# Patient Record
Sex: Male | Born: 1950 | Race: White | Hispanic: No | Marital: Married | Smoking: Current every day smoker
Health system: Southern US, Community
[De-identification: ages and names within clinical notes are randomized; demographics above are authoritative.]

## PROBLEM LIST (undated history)

## (undated) DIAGNOSIS — I82409 Acute embolism and thrombosis of unspecified deep veins of unspecified lower extremity: Secondary | ICD-10-CM

## (undated) DIAGNOSIS — M1990 Unspecified osteoarthritis, unspecified site: Secondary | ICD-10-CM

## (undated) DIAGNOSIS — E119 Type 2 diabetes mellitus without complications: Secondary | ICD-10-CM

---

## 2004-12-16 ENCOUNTER — Emergency Department: Payer: Self-pay | Admitting: Emergency Medicine

## 2005-08-03 ENCOUNTER — Other Ambulatory Visit: Payer: Self-pay

## 2005-08-03 ENCOUNTER — Emergency Department: Payer: Self-pay | Admitting: Unknown Physician Specialty

## 2007-03-05 ENCOUNTER — Emergency Department: Payer: Self-pay | Admitting: Emergency Medicine

## 2007-03-20 ENCOUNTER — Emergency Department: Payer: Self-pay | Admitting: Emergency Medicine

## 2008-01-28 ENCOUNTER — Emergency Department: Payer: Self-pay | Admitting: Emergency Medicine

## 2008-02-28 ENCOUNTER — Emergency Department: Payer: Self-pay | Admitting: Emergency Medicine

## 2008-02-28 ENCOUNTER — Other Ambulatory Visit: Payer: Self-pay

## 2011-11-28 ENCOUNTER — Emergency Department: Payer: Self-pay | Admitting: Emergency Medicine

## 2011-11-28 LAB — RAPID INFLUENZA A&B ANTIGENS

## 2011-11-30 LAB — BETA STREP CULTURE(ARMC)

## 2011-12-10 ENCOUNTER — Emergency Department: Payer: Self-pay | Admitting: Emergency Medicine

## 2011-12-10 LAB — COMPREHENSIVE METABOLIC PANEL
Alkaline Phosphatase: 49 U/L — ABNORMAL LOW (ref 50–136)
BUN: 12 mg/dL (ref 7–18)
Bilirubin,Total: 0.7 mg/dL (ref 0.2–1.0)
Calcium, Total: 8.7 mg/dL (ref 8.5–10.1)
Co2: 22 mmol/L (ref 21–32)
Creatinine: 0.84 mg/dL (ref 0.60–1.30)
EGFR (Non-African Amer.): >60
Glucose: 198 mg/dL — ABNORMAL HIGH (ref 65–99)
Osmolality: 283 (ref 275–301)
SGOT(AST): 16 U/L (ref 15–37)
SGPT (ALT): 19 U/L

## 2011-12-10 LAB — CBC WITH DIFFERENTIAL/PLATELET
Basophil #: 0.1 10*3/uL (ref 0.0–0.1)
Eosinophil %: 1.3 %
Lymphocyte #: 4 10*3/uL — ABNORMAL HIGH (ref 1.0–3.6)
Lymphocyte %: 32.1 %
MCH: 30.7 pg (ref 26.0–34.0)
MCV: 92 fL (ref 80–100)
Monocyte #: 0.7 10*3/uL (ref 0.0–0.7)
Monocyte %: 5.2 %
Neutrophil %: 60.9 %
Platelet: 378 10*3/uL (ref 150–440)
RBC: 4.74 10*6/uL (ref 4.40–5.90)
RDW: 12.9 % (ref 11.5–14.5)
WBC: 12.6 10*3/uL — ABNORMAL HIGH (ref 3.8–10.6)

## 2011-12-10 LAB — TROPONIN I: Troponin-I: <0.02 ng/mL

## 2011-12-10 LAB — TSH: Thyroid Stimulating Horm: 1.5 u[IU]/mL

## 2014-08-05 ENCOUNTER — Emergency Department: Payer: Self-pay | Admitting: Emergency Medicine

## 2014-08-05 LAB — CBC
HCT: 46.9 % (ref 40.0–52.0)
HGB: 15.3 g/dL (ref 13.0–18.0)
MCH: 30.5 pg (ref 26.0–34.0)
MCHC: 32.5 g/dL (ref 32.0–36.0)
MCV: 94 fL (ref 80–100)
Platelet: 277 10*3/uL (ref 150–440)
RBC: 5 10*6/uL (ref 4.40–5.90)
RDW: 13.9 % (ref 11.5–14.5)
WBC: 12.3 10*3/uL — AB (ref 3.8–10.6)

## 2014-08-05 LAB — URINALYSIS, COMPLETE
Bacteria: NONE SEEN
RBC,UR: 90215 /HPF (ref 0–5)
Specific Gravity: 1.021 (ref 1.003–1.030)
Squamous Epithelial: NONE SEEN

## 2014-08-05 LAB — BASIC METABOLIC PANEL
ANION GAP: 9 (ref 7–16)
BUN: 11 mg/dL (ref 7–18)
CALCIUM: 8.9 mg/dL (ref 8.5–10.1)
CO2: 27 mmol/L (ref 21–32)
CREATININE: 1.03 mg/dL (ref 0.60–1.30)
Chloride: 103 mmol/L (ref 98–107)
EGFR (African American): >60
EGFR (Non-African Amer.): >60
GLUCOSE: 199 mg/dL — AB (ref 65–99)
Osmolality: 283 (ref 275–301)
POTASSIUM: 4.1 mmol/L (ref 3.5–5.1)
Sodium: 139 mmol/L (ref 136–145)

## 2014-09-05 ENCOUNTER — Emergency Department: Payer: Self-pay | Admitting: Emergency Medicine

## 2014-09-05 LAB — CBC WITH DIFFERENTIAL/PLATELET
BASOS ABS: 0.1 10*3/uL (ref 0.0–0.1)
Basophil %: 0.9 %
EOS ABS: 0.1 10*3/uL (ref 0.0–0.7)
EOS PCT: 1 %
HCT: 49 % (ref 40.0–52.0)
HGB: 15.8 g/dL (ref 13.0–18.0)
LYMPHS PCT: 21.8 %
Lymphocyte #: 2.1 10*3/uL (ref 1.0–3.6)
MCH: 30.5 pg (ref 26.0–34.0)
MCHC: 32.3 g/dL (ref 32.0–36.0)
MCV: 94 fL (ref 80–100)
MONO ABS: 0.8 x10 3/mm (ref 0.2–1.0)
Monocyte %: 8.2 %
NEUTROS ABS: 6.7 10*3/uL — AB (ref 1.4–6.5)
Neutrophil %: 68.1 %
Platelet: 277 10*3/uL (ref 150–440)
RBC: 5.19 10*6/uL (ref 4.40–5.90)
RDW: 13.5 % (ref 11.5–14.5)
WBC: 9.9 10*3/uL (ref 3.8–10.6)

## 2014-09-05 LAB — BASIC METABOLIC PANEL
ANION GAP: 5 — AB (ref 7–16)
BUN: 8 mg/dL (ref 7–18)
Calcium, Total: 9.2 mg/dL (ref 8.5–10.1)
Chloride: 105 mmol/L (ref 98–107)
Co2: 29 mmol/L (ref 21–32)
Creatinine: 0.94 mg/dL (ref 0.60–1.30)
EGFR (African American): >60
Glucose: 212 mg/dL — ABNORMAL HIGH (ref 65–99)
Osmolality: 282 (ref 275–301)
Potassium: 4.2 mmol/L (ref 3.5–5.1)
SODIUM: 139 mmol/L (ref 136–145)

## 2015-10-09 ENCOUNTER — Emergency Department: Payer: Medicare Other

## 2015-10-09 ENCOUNTER — Emergency Department
Admission: EM | Admit: 2015-10-09 | Discharge: 2015-10-09 | Disposition: A | Discharge status: Home / Self Care | Payer: Medicare Other | Attending: Emergency Medicine | Admitting: Emergency Medicine

## 2015-10-09 ENCOUNTER — Encounter: Payer: Self-pay | Admitting: *Deleted

## 2015-10-09 DIAGNOSIS — E119 Type 2 diabetes mellitus without complications: Secondary | ICD-10-CM | POA: Insufficient documentation

## 2015-10-09 DIAGNOSIS — I82411 Acute embolism and thrombosis of right femoral vein: Secondary | ICD-10-CM | POA: Diagnosis not present

## 2015-10-09 DIAGNOSIS — Z880 Allergy status to penicillin: Secondary | ICD-10-CM | POA: Insufficient documentation

## 2015-10-09 DIAGNOSIS — F17200 Nicotine dependence, unspecified, uncomplicated: Secondary | ICD-10-CM | POA: Insufficient documentation

## 2015-10-09 DIAGNOSIS — M79604 Pain in right leg: Secondary | ICD-10-CM | POA: Diagnosis present

## 2015-10-09 HISTORY — DX: Type 2 diabetes mellitus without complications: E11.9

## 2015-10-09 HISTORY — DX: Unspecified osteoarthritis, unspecified site: M19.90

## 2015-10-09 HISTORY — DX: Acute embolism and thrombosis of unspecified deep veins of unspecified lower extremity: I82.409

## 2015-10-09 LAB — COMPREHENSIVE METABOLIC PANEL
ALBUMIN: 4 g/dL (ref 3.5–5.0)
ALK PHOS: 49 U/L (ref 38–126)
ALT: 15 U/L — ABNORMAL LOW (ref 17–63)
ANION GAP: 9 (ref 5–15)
AST: 14 U/L — ABNORMAL LOW (ref 15–41)
BILIRUBIN TOTAL: 0.7 mg/dL (ref 0.3–1.2)
BUN: 12 mg/dL (ref 6–20)
CALCIUM: 9.3 mg/dL (ref 8.9–10.3)
CO2: 24 mmol/L (ref 22–32)
Chloride: 106 mmol/L (ref 101–111)
Creatinine, Ser: 0.8 mg/dL (ref 0.61–1.24)
GFR calc Af Amer: >60 mL/min (ref >60)
Glucose, Bld: 218 mg/dL — ABNORMAL HIGH (ref 65–99)
Potassium: 4.4 mmol/L (ref 3.5–5.1)
Sodium: 139 mmol/L (ref 135–145)
TOTAL PROTEIN: 7 g/dL (ref 6.5–8.1)

## 2015-10-09 LAB — CBC
HEMATOCRIT: 44.6 % (ref 40.0–52.0)
HEMOGLOBIN: 14.4 g/dL (ref 13.0–18.0)
MCH: 29.1 pg (ref 26.0–34.0)
MCHC: 32.2 g/dL (ref 32.0–36.0)
MCV: 90.3 fL (ref 80.0–100.0)
Platelets: 262 10*3/uL (ref 150–440)
RBC: 4.94 MIL/uL (ref 4.40–5.90)
RDW: 13.6 % (ref 11.5–14.5)
WBC: 11.7 10*3/uL — ABNORMAL HIGH (ref 3.8–10.6)

## 2015-10-09 MED ORDER — RIVAROXABAN (XARELTO) VTE STARTER PACK (15 & 20 MG): ORAL_TABLET | ORAL | Status: AC

## 2015-10-09 NOTE — ED Provider Notes (Signed)
[facility] Emergency Department Provider Note  ____________________________________________  Time seen: Approximately 4:07 PM  I have reviewed the triage vital signs and the nursing notes.   HISTORY  Chief Complaint Leg Pain    HPI Jimmy Valenzuela is a 64 y.o. male with a history ofright lower extremity DVT 2015 with improved symptoms and taken off several toe after 3.5 months presenting with return of right lower extremity pain.  Patient reports that over the last several days he has noticed increasing pain in his right lower extremity. There is nothing he can do to make the pain better or worse. He does not have any other associated swelling, skin changes, color changes, or warmth. No trauma. Patient denies any chest pain or shortness of breath.   Past Medical History  Diagnosis Date  . DVT (deep venous thrombosis) (HCC)   . Diabetes mellitus without complication (HCC)   . Arthritis     There are no active problems to display for this patient.   History reviewed. No pertinent past surgical history.  Current Outpatient Rx  Name  Route  Sig  Dispense  Refill  . Rivaroxaban (XARELTO STARTER PACK) 15 & 20 MG TBPK      Take as directed on package: Start with one  tablet by mouth twice a day with food. On Day 22, switch to one  tablet once a day with food.   51 each   0     Allergies Morphine and related and Penicillins  No family history on file.  Social History Social History  Substance Use Topics  . Smoking status: Current Every Day Smoker  . Smokeless tobacco: None  . Alcohol Use: No    Review of Systems Constitutional: No fever/chills. No lightheadedness or syncopal. Eyes: No visual changes. ENT: No sore throat. Cardiovascular: Denies chest pain, palpitations. Respiratory: Denies shortness of breath.  No cough. Gastrointestinal: No abdominal pain.  No nausea, no vomiting.  No diarrhea.  No constipation. Musculoskeletal:  Negative for back pain. Positive for right lower extremity pain. Skin: Negative for rash. Neurological: Negative for headaches, focal weakness or numbness.  10-point ROS otherwise negative.  ____________________________________________   PHYSICAL EXAM:  VITAL SIGNS: ED Triage Vitals  Enc Vitals Group     BP 10/09/15 1342 142/75 mmHg     Pulse Rate 10/09/15 1342 93     Resp 10/09/15 1342 20     Temp 10/09/15 1342 98.3 F (36.8 C)     Temp Source 10/09/15 1342 Oral     SpO2 10/09/15 1342 96 %     Weight 10/09/15 1342 180 lb (81.647 kg)     Height 10/09/15 1342  (1.778 m)     Head Cir --      Peak Flow --      Pain Score 10/09/15 1343 9     Pain Loc --      Pain Edu? --      Excl. in GC? --     Constitutional: Alert and oriented. Well appearing and in no acute distress. Answer question appropriately. Eyes: Conjunctivae are normal.  EOMI. Head: Atraumatic. Nose: No congestion/rhinnorhea. Mouth/Throat: Mucous membranes are moist.  Neck: No stridor.  Supple.   Cardiovascular: Normal rate,   Respiratory: Normal respiratory effort.  No respiratory distress Musculoskeletal: No LE edema. No palpable cords or Homans sign. Normal skin in the right lower extremity. Normal right DP and PT pulse. Refill is less than 2 seconds. Neurologic:  Normal speech and  language. No gross focal neurologic deficits are appreciated.  Skin:  Skin is warm, dry and intact. No rash noted. Psychiatric: Mood and affect are normal. Speech and behavior are normal.  Normal judgement.  ____________________________________________   LABS (all labs ordered are listed, but only abnormal results are displayed)  Labs Reviewed  CBC - Abnormal; Notable for the following:    WBC 11.7 (*)    All other components within normal limits  COMPREHENSIVE METABOLIC PANEL - Abnormal; Notable for the following:    Glucose, Bld 218 (*)    AST 14 (*)    ALT 15 (*)    All other components within normal limits    ____________________________________________  EKG  Not indicated ____________________________________________  RADIOLOGY  US Venous Img Lower Bilateral  10/09/2015  CLINICAL DATA:  BILATERAL leg pain for 1 month EXAM: BILATERAL LOWER EXTREMITY VENOUS DOPPLER ULTRASOUND TECHNIQUE: Gray-scale sonography with graded compression, as well as color Doppler and duplex ultrasound were performed to evaluate the lower extremity deep venous systems from the level of the common femoral vein and including the common femoral, femoral, profunda femoral, popliteal and calf veins including the posterior tibial, peroneal and gastrocnemius veins when visible. The superficial great saphenous vein was also interrogated. Spectral Doppler was utilized to evaluate flow at rest and with distal augmentation maneuvers in the common femoral, femoral and popliteal veins. COMPARISON:  09/05/2014 RIGHT lower extremity duplex venous ultrasound FINDINGS: RIGHT LOWER EXTREMITY Common Femoral Vein: No evidence of thrombus. Normal compressibility, respiratory phasicity and response to augmentation. Saphenofemoral Junction: No evidence of thrombus. Normal compressibility and flow on color Doppler imaging. Profunda Femoral Vein: No evidence of thrombus. Normal compressibility and flow on color Doppler imaging. Femoral Vein: Segment of mid femoral vein demonstrates incomplete compressibility with intraluminal echogenic material which is mildly hyperechoic. This was seen on the previous exam as well as consistent with chronic DVT. No new hypoechoic intraluminal thrombus identified. Popliteal Vein: No evidence of thrombus. Normal compressibility, respiratory phasicity and response to augmentation. Calf Veins: No evidence of thrombus. Normal compressibility and flow on color Doppler imaging. Superficial Great Saphenous Vein: No evidence of thrombus. Normal compressibility and flow on color Doppler imaging. Venous Reflux:  None. Other Findings:   None. LEFT LOWER EXTREMITY Common Femoral Vein: No evidence of thrombus. Normal compressibility, respiratory phasicity and response to augmentation. Saphenofemoral Junction: No evidence of thrombus. Normal compressibility and flow on color Doppler imaging. Profunda Femoral Vein: No evidence of thrombus. Normal compressibility and flow on color Doppler imaging. Femoral Vein: No evidence of thrombus. Normal compressibility, respiratory phasicity and response to augmentation. Popliteal Vein: No evidence of thrombus. Normal compressibility, respiratory phasicity and response to augmentation. Calf Veins: No evidence of thrombus. Normal compressibility and flow on color Doppler imaging. Superficial Great Saphenous Vein: No evidence of thrombus. Normal compressibility and flow on color Doppler imaging. Venous Reflux:  None. Other Findings:  None. IMPRESSION: Segment of chronic thrombus again identified within the RIGHT femoral vein unchanged from previous exam. Otherwise negative study without evidence of acute DVT in either lower extremity. Electronically Signed   By: Ulyses Southward M.D.   On: 10/09/2015 14:53    ____________________________________________   PROCEDURES  Procedure(s) performed: None  Critical Care performed: No ____________________________________________   INITIAL IMPRESSION / ASSESSMENT AND PLAN / ED COURSE  Pertinent labs & imaging results that were available during my care of the patient were reviewed by me and considered in my medical decision making (see chart for details).  64 y.o. male  with a history of DVT presenting with recurrent right lower extremity DVT. The patient will need to be restarted on Xarelto. I will check basic labs before discharging him to rule out anemia or renal or hepatic insufficiency. He will follow-up with his primary care physician. At this time he has no signs or symptoms that would be concerning for  PE.  ____________________________________________  FINAL CLINICAL IMPRESSION(S) / ED DIAGNOSES  Final diagnoses:  Acute deep vein thrombosis (DVT) of femoral vein of right lower extremity (HCC)      NEW MEDICATIONS STARTED DURING THIS VISIT:  New Prescriptions   RIVAROXABAN (XARELTO STARTER PACK) 15 & 20 MG TBPK    Take as directed on package: Start with one 15mg  tablet by mouth twice a day with food. On Day 22, switch to one 20mg  tablet once a day with food.     Rockne MenghiniAnne-Caroline Lauriel Helin, MD 10/09/15 1655

## 2015-10-09 NOTE — Discharge Instructions (Signed)
Please make a follow-up appointment with your primary care physician. He will need to be reevaluated for the length of time that you need to be on a blood thinner medicine. Please do not take any NSAID medications such as Motrin, Advil, ibuprofen or Aleve when you are taking Xarelto. Please return to the emergency department if he developed chest pain, shortness of breath, palpitations, fainting, bleeding, or any other symptoms concerning to you.  Deep Vein Thrombosis A deep vein thrombosis (DVT) is a blood clot (thrombus) that usually occurs in a deep, larger vein of the lower leg or the pelvis, or in an upper extremity such as the arm. These are dangerous and can lead to serious and even life-threatening complications if the clot travels to the lungs. A DVT can damage the valves in your leg veins so that instead of flowing upward, the blood pools in the lower leg. This is called post-thrombotic syndrome, and it can result in pain, swelling, discoloration, and sores on the leg. CAUSES A DVT is caused by the formation of a blood clot in your leg, pelvis, or arm. Usually, several things contribute to the formation of blood clots. A clot may develop when:  Your blood flow slows down.  Your vein becomes damaged in some way.  You have a condition that makes your blood clot more easily. RISK FACTORS A DVT is more likely to develop in:  People who are older, especially over 32 years of age.  People who are overweight (obese).  People who sit or lie still for a long time, such as during long-distance travel (over 4 hours), bed rest, hospitalization, or during recovery from certain medical conditions like a stroke.  People who do not engage in much physical activity (sedentary lifestyle).  People who have chronic breathing disorders.  People who have a personal or family history of blood clots or blood clotting disease.  People who have peripheral vascular disease (PVD), diabetes, or some types  of cancer.  People who have heart disease, especially if the person had a recent heart attack or has congestive heart failure.  People who have neurological diseases that affect the legs (leg paresis).  People who have had a traumatic injury, such as breaking a hip or leg.  People who have recently had major or lengthy surgery, especially on the hip, knee, or abdomen.  People who have had a central line placed inside a large vein.  People who take medicines that contain the hormone estrogen. These include birth control pills and hormone replacement therapy.  Pregnancy or during childbirth or the postpartum period.  Long plane flights (over 8 hours). SIGNS AND SYMPTOMS Symptoms of a DVT can include:   Swelling of your leg or arm, especially if one side is much worse.  Warmth and redness of your leg or arm, especially if one side is much worse.  Pain in your arm or leg. If the clot is in your leg, symptoms may be more noticeable or worse when you stand or walk.  A feeling of pins and needles, if the clot is in the arm. The symptoms of a DVT that has traveled to the lungs (pulmonary embolism, PE) usually start suddenly and include:  Shortness of breath while active or at rest.  Coughing or coughing up blood or blood-tinged mucus.  Chest pain that is often worse with deep breaths.  Rapid or irregular heartbeat.  Feeling light-headed or dizzy.  Fainting.  Feeling anxious.  Sweating. There may also be  pain and swelling in a leg if that is where the blood clot started. These symptoms may represent a serious problem that is an emergency. Do not wait to see if the symptoms will go away. Get medical help right away. Call your local emergency services (911 in the [place] Do not drive yourself to the hospital. DIAGNOSIS Your health care provider will take a medical history and perform a physical exam. You may also have other tests, including:  Blood tests to assess the clotting  properties of your blood.  Imaging tests, such as CT, ultrasound, MRI, X-ray, and other tests to see if you have clots anywhere in your body. TREATMENT After a DVT is identified, it can be treated. The type of treatment that you receive depends on many factors, such as the cause of your DVT, your risk for bleeding or developing more clots, and other medical conditions that you have. Sometimes, a combination of treatments is necessary. Treatment options may be combined and include:  Monitoring the blood clot with ultrasound.  Taking medicines by mouth, such as newer blood thinners (anticoagulants), thrombolytics, or warfarin.  Taking anticoagulant medicine by injection or through an IV tube.  Wearing compression stockings or using different types ofdevices.  Surgery (rare) to remove the blood clot or to place a filter in your abdomen to stop the blood clot from traveling to your lungs. Treatments for a DVT are often divided into immediate treatment and long-term treatment (up to 3 months after DVT). You can work with your health care provider to choose the treatment program that is best for you. HOME CARE INSTRUCTIONS If you are taking a newer oral anticoagulant:  Take the medicine every single day at the same time each day.  Understand what foods and drugs interact with this medicine.  Understand that there are no regular blood tests required when using this medicine.  Understand the side effects of this medicine, including excessive bruising or bleeding. Ask your health care provider or pharmacist about other possible side effects. If you are taking warfarin:  Understand how to take warfarin and know which foods can affect how warfarin works in Public relations account executiveyour body.  Understand that it is dangerous to take too much or too little warfarin. Too much warfarin increases the risk of bleeding. Too little warfarin continues to allow the risk for blood clots.  Follow your PT and INR blood testing  schedule. The PT and INR results allow your health care provider to adjust your dose of warfarin. It is very important that you have your PT and INR tested as often as told by your health care provider.  Avoid major changes in your diet, or tell your health care provider before you change your diet. Arrange a visit with a registered dietitian to answer your questions. Many foods, especially foods that are high in vitamin K, can interfere with warfarin and affect the PT and INR results. Eat a consistent amount of foods that are high in vitamin K, such as:  Spinach, kale, broccoli, cabbage, collard greens, turnip greens, Brussels sprouts, peas, cauliflower, seaweed, and parsley.  Beef liver and pork liver.  Green tea.  Soybean oil.  Tell your health care provider about any and all medicines, vitamins, and supplements that you take, including aspirin and other over-the-counter anti-inflammatory medicines. Be especially cautious with aspirin and anti-inflammatory medicines. Do not take those before you ask your health care provider if it is safe to do so. This is important because many medicines can interfere  with warfarin and affect the PT and INR results.  Do not start or stop taking any over-the-counter or prescription medicine unless your health care provider or pharmacist tells you to do so. If you take warfarin, you will also need to do these things:  Hold pressure over cuts for longer than usual.  Tell your dentist and other health care providers that you are taking warfarin before you have any procedures in which bleeding may occur.  Avoid alcohol or drink very small amounts. Tell your health care provider if you change your alcohol intake.  Do not use tobacco products, including cigarettes, chewing tobacco, and e-cigarettes. If you need help quitting, ask your health care provider.  Avoid contact sports. General Instructions  Take over-the-counter and prescription medicines only as  told by your health care provider. Anticoagulant medicines can have side effects, including easy bruising and difficulty stopping bleeding. If you are prescribed an anticoagulant, you will also need to do these things:  Hold pressure over cuts for longer than usual.  Tell your dentist and other health care providers that you are taking anticoagulants before you have any procedures in which bleeding may occur.  Avoid contact sports.  Wear a medical alert bracelet or carry a medical alert card that says you have had a PE.  Ask your health care provider how soon you can go back to your normal activities. Stay active to prevent new blood clots from forming.  Make sure to exercise while traveling or when you have been sitting or standing for a long period of time. It is very important to exercise. Exercise your legs by walking or by tightening and relaxing your leg muscles often. Take frequent walks.  Wear compression stockings as told by your health care provider to help prevent more blood clots from forming.  Do not use tobacco products, including cigarettes, chewing tobacco, and e-cigarettes. If you need help quitting, ask your health care provider.  Keep all follow-up appointments with your health care provider. This is important. PREVENTION Take these actions to decrease your risk of developing another DVT:  Exercise regularly. For at least 30 minutes every day, engage in:  Activity that involves moving your arms and legs.  Activity that encourages good blood flow through your body by increasing your heart rate.  Exercise your arms and legs every hour during long-distance travel (over 4 hours). Drink plenty of water and avoid drinking alcohol while traveling.  Avoid sitting or lying in bed for long periods of time without moving your legs.  Maintain a weight that is appropriate for your height. Ask your health care provider what weight is healthy for you.  If you are a woman who is  over 31 years of age, avoid unnecessary use of medicines that contain estrogen. These include birth control pills.  Do not smoke, especially if you take estrogen medicines. If you need help quitting, ask your health care provider. If you are hospitalized, prevention measures may include:  Early walking after surgery, as soon as your health care provider says that it is safe.  Receiving anticoagulants to prevent blood clots.If you cannot take anticoagulants, other options may be available, such as wearing compression stockings or using different types of devices. SEEK IMMEDIATE MEDICAL CARE IF:  You have new or increased pain, swelling, or redness in an arm or leg.  You have numbness or tingling in an arm or leg.  You have shortness of breath while active or at rest.  You have chest  pain.  You have a rapid or irregular heartbeat.  You feel light-headed or dizzy.  You cough up blood.  You notice blood in your vomit, bowel movement, or urine. These symptoms may represent a serious problem that is an emergency. Do not wait to see if the symptoms will go away. Get medical help right away. Call your local emergency services (911 in the [place] Do not drive yourself to the hospital.   This information is not intended to replace advice given to you by your health care provider. Make sure you discuss any questions you have with your health care provider.   Document Released: 10/24/2005 Document Revised: 07/15/2015 Document Reviewed: 02/18/2015 Elsevier Interactive Patient Education [employer]

## 2015-10-09 NOTE — ED Notes (Signed)
Pt resting in bed in no acute distress 

## 2015-10-09 NOTE — ED Notes (Signed)
Has pain in both calf, his hips,

## 2016-07-01 ENCOUNTER — Encounter: Payer: Self-pay | Admitting: Emergency Medicine

## 2016-07-01 ENCOUNTER — Emergency Department
Admission: EM | Admit: 2016-07-01 | Discharge: 2016-07-01 | Disposition: A | Discharge status: Home / Self Care | Payer: Medicare Other | Attending: Emergency Medicine | Admitting: Emergency Medicine

## 2016-07-01 DIAGNOSIS — K649 Unspecified hemorrhoids: Secondary | ICD-10-CM | POA: Diagnosis not present

## 2016-07-01 DIAGNOSIS — F17200 Nicotine dependence, unspecified, uncomplicated: Secondary | ICD-10-CM | POA: Insufficient documentation

## 2016-07-01 DIAGNOSIS — I87009 Postthrombotic syndrome without complications of unspecified extremity: Secondary | ICD-10-CM | POA: Insufficient documentation

## 2016-07-01 DIAGNOSIS — E119 Type 2 diabetes mellitus without complications: Secondary | ICD-10-CM | POA: Diagnosis not present

## 2016-07-01 NOTE — ED Notes (Signed)
Patient called the second time for triage. No response. RN will reattempt to call patient one final time prior to eloping from the status board.   

## 2016-07-01 NOTE — ED Triage Notes (Signed)
hemorrhoid pain x 6 days.  Has been using preparation H, no relief.

## 2016-07-01 NOTE — ED Notes (Signed)
Patient called for triage by this RN. No answer. Will reattempt. 

## 2016-07-01 NOTE — ED Notes (Signed)
Patient called for the third time. No response. Patient eloped from the status board at this time. ED registration and charge nurse made aware so that in the event that patient presents back to the desk requesting to be seen. 

## 2017-09-22 ENCOUNTER — Other Ambulatory Visit: Payer: Self-pay

## 2017-09-22 ENCOUNTER — Emergency Department: Payer: Medicare Other

## 2017-09-22 ENCOUNTER — Encounter: Payer: Self-pay | Admitting: Emergency Medicine

## 2017-09-22 ENCOUNTER — Emergency Department
Admission: EM | Admit: 2017-09-22 | Discharge: 2017-09-22 | Disposition: A | Discharge status: Home / Self Care | Payer: Medicare Other | Attending: Emergency Medicine | Admitting: Emergency Medicine

## 2017-09-22 DIAGNOSIS — Z7901 Long term (current) use of anticoagulants: Secondary | ICD-10-CM | POA: Diagnosis not present

## 2017-09-22 DIAGNOSIS — W109XXA Fall (on) (from) unspecified stairs and steps, initial encounter: Secondary | ICD-10-CM | POA: Insufficient documentation

## 2017-09-22 DIAGNOSIS — S3992XA Unspecified injury of lower back, initial encounter: Secondary | ICD-10-CM | POA: Diagnosis present

## 2017-09-22 DIAGNOSIS — W19XXXA Unspecified fall, initial encounter: Secondary | ICD-10-CM

## 2017-09-22 DIAGNOSIS — S39012A Strain of muscle, fascia and tendon of lower back, initial encounter: Secondary | ICD-10-CM | POA: Insufficient documentation

## 2017-09-22 DIAGNOSIS — Y92008 Other place in unspecified non-institutional (private) residence as the place of occurrence of the external cause: Secondary | ICD-10-CM | POA: Insufficient documentation

## 2017-09-22 DIAGNOSIS — Y999 Unspecified external cause status: Secondary | ICD-10-CM | POA: Diagnosis not present

## 2017-09-22 DIAGNOSIS — S29019A Strain of muscle and tendon of unspecified wall of thorax, initial encounter: Secondary | ICD-10-CM | POA: Diagnosis not present

## 2017-09-22 DIAGNOSIS — Y9301 Activity, walking, marching and hiking: Secondary | ICD-10-CM | POA: Insufficient documentation

## 2017-09-22 DIAGNOSIS — F17200 Nicotine dependence, unspecified, uncomplicated: Secondary | ICD-10-CM | POA: Diagnosis not present

## 2017-09-22 DIAGNOSIS — E119 Type 2 diabetes mellitus without complications: Secondary | ICD-10-CM | POA: Diagnosis not present

## 2017-09-22 NOTE — Discharge Instructions (Signed)
Modify activities until symptoms improve.  You may use cold/cold pack or heat therapy for symptom management as needed.  If symptoms not fully resolve you may follow-up with orthopedics.  Contact information is included in your discharge instructions.  If symptoms significantly worsen do not hesitate to return the emergency department.

## 2017-09-22 NOTE — ED Notes (Signed)
Patient presents to the ED with mid back pain post fall this morning at 8am.  Patient states he slipped on some ice on his deck while going down steps and fell on the steps.  Patient reports some shortness of breath with the back pain.  Patient is speaking in full sentences at this time.  Patient denies hitting his head or losing consciousness.  Patient denies any neck pain.  Patient is complaining of pain to his left buttocks.

## 2017-09-22 NOTE — ED Provider Notes (Signed)
[facility] Emergency Department Provider Note   ____________________________________________   I have reviewed the triage vital signs and the nursing notes.   HISTORY  Chief Complaint Fall    HPI Jimmy Valenzuela is a 66 y.o. male to the emergency department with thoracic and lumbar back pain after falling on his deck steps earlier this morning.  Patient describes striking his mid back during the fall.  Patient denies loss of consciousness, hitting his head or neck.  Patient reports immediate pain in his back and denies any other injury.  Patient reports past muscular skeletal back injuries requiring no surgical intervention.  Patient denies any lower extremity radicular symptoms, bowel or bladder dysfunction or saddle anesthesia. Patient denies fever, chills, headache, vision changes, chest pain, chest tightness, shortness of breath, abdominal pain, nausea and vomiting.  Past Medical History:  Diagnosis Date  . Arthritis   . Diabetes mellitus without complication (HCC)   . DVT (deep venous thrombosis) (HCC)     There are no active problems to display for this patient.   History reviewed. No pertinent surgical history.  Prior to Admission medications   Medication Sig Start Date End Date Taking? Authorizing Provider  Rivaroxaban (XARELTO STARTER PACK) 15 & 20 MG TBPK Take as directed on package: Start with one 15mg  tablet by mouth twice a day with food. On Day 22, switch to one 20mg  tablet once a day with food. 10/09/15   Rockne MenghiniNorman, Anne-Caroline, MD    Allergies Morphine and related and Penicillins  No family history on file.  Social History Social History   Tobacco Use  . Smoking status: Current Every Day Smoker  . Smokeless tobacco: Never Used  Substance Use Topics  . Alcohol use: No  . Drug use: Not on file    Review of Systems Constitutional: Negative for fever/chills Eyes: No visual changes. ENT:  Negative for sore throat and for  difficulty swallowing Cardiovascular: Denies chest pain. Respiratory: Denies shortness of breath. Musculoskeletal: Positive for thoracic and lumbar back pain. Skin: Negative for rash. Neurological: Negative for headaches.  Negative focal weakness or numbness. Negative for loss of consciousness. Able to ambulate. ____________________________________________   PHYSICAL EXAM:  VITAL SIGNS: Patient Vitals for the past 24 hrs:  BP Temp Temp src Pulse Resp SpO2 Height Weight  09/22/17 1314 125/68 98 F (36.7 C) Oral 78 16 100 % - -  09/22/17 1008 - - - - - - 5\' 10"  (1.778 m) 67.1 kg (148 lb)  09/22/17 1007 (!) 163/105 98.3 F (36.8 C) Oral 87 18 100 % - -    Constitutional: Alert and oriented. Well appearing and in no acute distress.  Eyes: Conjunctivae are normal. PERRL. EOMI  Head: Normocephalic and atraumatic. Neck:Supple. No thyromegaly. No stridor.  Cardiovascular: Normal rate, regular rhythm.  Good peripheral circulation. Respiratory: Normal respiratory effort without tachypnea or retractions. Lungs CTAB.  Hematological/Lymphatic/Immunological: No cervical lymphadenopathy. Musculoskeletal:Thoracic and lumbar range of motion all planes intact. Tenderness to palpation along the lower thoracic and lumbar spinous processes,paraspinal musculature. No deformities noted along the thoracic or lumbar spine with palpation.   Negative lower extremity radiculopathy, bowel or bladder dysfunction or saddle anesthesia. No deformity or paradoxical movement of rib cage noted.  Neurologic: Normal speech and language.  Skin:  Skin is warm, dry and intact. No rash noted. Psychiatric: Mood and affect are normal. Speech and behavior are normal. Patient exhibits appropriate insight and judgement.  ____________________________________________   LABS (all labs ordered are listed, but only  abnormal results are displayed)  Labs Reviewed - No data to  display ____________________________________________  EKG none ____________________________________________  RADIOLOGY DG Chest 2 view / DG Thoracic spine 2 view / DG Lumbar spine complete  FINDINGS: The cardiomediastinal silhouette is normal in size. Normal pulmonary vascularity. Slightly coarsened interstitial markings, likely smoking-related. No focal consolidation, pleural effusion, or pneumothorax. Skin fold over the peripheral right lung. No acute osseous abnormality. No definite rib fracture.  IMPRESSION: No active cardiopulmonary disease.  FINDINGS: 12 rib-bearing thoracic vertebrae with anatomic posterior alignment. No fractures. DISH throughout the thoracic spine. Pedicles intact. No paraspinous hematoma.  IMPRESSION: 1. No acute osseous abnormality. 2. DISH.   FINDINGS: Five non-rib-bearing lumbar vertebrae. Degenerative slight grade 1 spondylolisthesis of L4 on L5 measuring approximately 7 mm. Well-preserved disc spaces throughout the lumbar spine. Degenerative disc disease at T11-12 and T12-L1. Severe facet degenerative changes at L4-5 and L5-S1.  Calcified gallstones in the right upper quadrant the abdomen. Aortoiliac atherosclerosis without evidence of aneurysm. Calcification involving the vasa deferens bilaterally.  IMPRESSION: 1. No acute osseous abnormality. 2. Severe facet degenerative changes at L4-5 and L5-S1 resulting in degenerative grade 1 spondylolisthesis of L4 on L5 measuring approximately 7 mm. 3. Cholelithiasis.  ____________________________________________   PROCEDURES  Procedure(s) performed: no    Critical Care performed: no ____________________________________________   INITIAL IMPRESSION / ASSESSMENT AND PLAN / ED COURSE  Pertinent labs & imaging results that were available during my care of the patient were reviewed by me and considered in my medical decision making (see chart for details).   Patient presents to  emergency department thoracic and lumbar back pain after falling on his deck steps earlier this morning. History, physical exam findings and imaging are reassuring symptoms are consistent with thoracic and lumbar sprain/strain. Imaging was unremarkable for acute fracture, dislocation, subluxation, pneumothorax or pleural effusion. Patient advised to modify activity until symptoms improve. Patient refused pain management during course of care today. He stated he would take his pain medication once he arrived home. Advised him to follow up with PCP as needed or return to the emergency department if symptoms return or worsen. Patient informed of clinical course, understand medical decision-making process, and agree with plan.   ____________________________________________   FINAL CLINICAL IMPRESSION(S) / ED DIAGNOSES  Final diagnoses:  Fall, initial encounter  Strain of lumbar region, initial encounter  Thoracic myofascial strain, initial encounter       NEW MEDICATIONS STARTED DURING THIS VISIT:  This SmartLink is deprecated. Use AVSMEDLIST instead to display the medication list for a patient.   Note:  This document was prepared using Dragon voice recognition software and may include unintentional dictation errors.    Clois ComberLittle, Traci M, PA-C 09/22/17 1647    Jeanmarie PlantMcShane, James A, MD 09/23/17 [phone]

## 2017-09-22 NOTE — ED Triage Notes (Signed)
Fell this morning, slid on ice and hit mid back on steps.  C/O pain to back with movement and deep breathing.

## 2017-12-27 ENCOUNTER — Emergency Department
Admission: EM | Admit: 2017-12-27 | Discharge: 2017-12-27 | Disposition: A | Discharge status: Home / Self Care | Payer: Medicare Other | Attending: Emergency Medicine | Admitting: Emergency Medicine

## 2017-12-27 ENCOUNTER — Other Ambulatory Visit: Payer: Self-pay

## 2017-12-27 DIAGNOSIS — M79672 Pain in left foot: Secondary | ICD-10-CM

## 2017-12-27 DIAGNOSIS — Z794 Long term (current) use of insulin: Secondary | ICD-10-CM | POA: Diagnosis not present

## 2017-12-27 DIAGNOSIS — Z79899 Other long term (current) drug therapy: Secondary | ICD-10-CM | POA: Diagnosis not present

## 2017-12-27 DIAGNOSIS — Z86718 Personal history of other venous thrombosis and embolism: Secondary | ICD-10-CM | POA: Insufficient documentation

## 2017-12-27 DIAGNOSIS — B9689 Other specified bacterial agents as the cause of diseases classified elsewhere: Secondary | ICD-10-CM | POA: Insufficient documentation

## 2017-12-27 DIAGNOSIS — F17210 Nicotine dependence, cigarettes, uncomplicated: Secondary | ICD-10-CM | POA: Insufficient documentation

## 2017-12-27 DIAGNOSIS — E109 Type 1 diabetes mellitus without complications: Secondary | ICD-10-CM | POA: Insufficient documentation

## 2017-12-27 DIAGNOSIS — L089 Local infection of the skin and subcutaneous tissue, unspecified: Secondary | ICD-10-CM | POA: Insufficient documentation

## 2017-12-27 DIAGNOSIS — L84 Corns and callosities: Secondary | ICD-10-CM | POA: Diagnosis not present

## 2017-12-27 MED ORDER — TRAMADOL HCL 50 MG PO TABS
50.0000 mg | ORAL_TABLET | Freq: Once | ORAL | Status: DC
  Filled 2017-12-27: qty 1

## 2017-12-27 MED ORDER — SULFAMETHOXAZOLE-TRIMETHOPRIM 800-160 MG PO TABS: 1.0000 | ORAL_TABLET | Freq: Two times a day (BID) | ORAL | 0 refills | Status: AC

## 2017-12-27 MED ORDER — LIDOCAINE HCL (PF) 1 % IJ SOLN
5.0000 mL | Freq: Once | INTRAMUSCULAR | Status: AC
  Administered 2017-12-27: 5 mL

## 2017-12-27 MED ORDER — LIDOCAINE HCL (PF) 1 % IJ SOLN
INTRAMUSCULAR | Status: AC
  Administered 2017-12-27: 5 mL
  Filled 2017-12-27: qty 5

## 2017-12-27 MED ORDER — LIDOCAINE-EPINEPHRINE-TETRACAINE (LET) SOLUTION
3.0000 mL | Freq: Once | NASAL | Status: AC
Start: 2017-12-27 — End: 2017-12-27
  Administered 2017-12-27: 14:00:00 3 mL via TOPICAL
  Filled 2017-12-27: qty 3

## 2017-12-27 MED ORDER — SULFAMETHOXAZOLE-TRIMETHOPRIM 800-160 MG PO TABS
1.0000 | ORAL_TABLET | Freq: Once | ORAL | Status: AC
  Administered 2017-12-27: 1 via ORAL
  Filled 2017-12-27: qty 1

## 2017-12-27 MED ORDER — TRAMADOL HCL 50 MG PO TABS: 50.0000 mg | ORAL_TABLET | Freq: Four times a day (QID) | ORAL | 0 refills | Status: AC | PRN

## 2017-12-27 NOTE — ED Notes (Signed)
See triage note  States he noticed a callous area to left foot near great toe several days ago  But noticed some bleeding and redness to foot 2 days ago

## 2017-12-27 NOTE — ED Triage Notes (Signed)
Pt is type 2 DM. State L foot pain has callus at base of large toe (1st toe) callus has cracked and some blood noted. Bleeding controlled. States real painful. Appears to have blood collected under callus as well. Very tender to touch. Only redness is streak of redness going back toward ankle. Alert, oriented.

## 2017-12-27 NOTE — ED Provider Notes (Signed)
[facility] Emergency Department Provider Note   ____________________________________________   None    (approximate)  I have reviewed the triage vital signs and the nursing notes.   HISTORY  Chief Complaint Foot Pain    HPI Jimmy Valenzuela is a 67 y.o. male patient complain left foot pain secondary to callus on the plantar aspect of the first metatarsal head.  Patient states callus lesion has cracked and noticed blood and pus drainage.  Patient rates pain as a 10/10.  Area cannot bear weight secondary to pain.  Patient type I diabetic.  Patient states he has tried to trim the callus from the foot without success.  Past Medical History:  Diagnosis Date  . Arthritis   . Diabetes mellitus without complication (HCC)   . DVT (deep venous thrombosis) (HCC)     There are no active problems to display for this patient.   History reviewed. No pertinent surgical history.  Prior to Admission medications   Medication Sig Start Date End Date Taking? Authorizing Provider  atorvastatin (LIPITOR) 20 MG tablet Take 20 mg by mouth daily.   Yes [provider]  insulin glargine (LANTUS) 100 UNIT/ML injection Inject into the skin at bedtime.   Yes [provider]  metFORMIN (GLUCOPHAGE) 1000 MG tablet Take 1,000 mg by mouth 2 (two) times daily with a meal.   Yes [provider]  pioglitazone (ACTOS) 45 MG tablet Take 45 mg by mouth daily.   Yes [provider]  Rivaroxaban (XARELTO STARTER PACK) 15 & 20 MG TBPK Take as directed on package: Start with one 15mg  tablet by mouth twice a day with food. On Day 22, switch to one 20mg  tablet once a day with food. 10/09/15   Rockne MenghiniNorman, Anne-Caroline, MD  sulfamethoxazole-trimethoprim (BACTRIM DS,SEPTRA DS) 800-160 MG tablet Take 1 tablet by mouth 2 (two) times daily. 12/27/17   Joni ReiningSmith, Vivion Romano K, PA-C  traMADol (ULTRAM) 50 MG tablet Take 1 tablet (50 mg total) by mouth every 6 (six) hours as needed.  12/27/17 12/27/18  Joni ReiningSmith, Keevin Panebianco K, PA-C    Allergies Morphine and related and Penicillins  History reviewed. No pertinent family history.  Social History Social History   Tobacco Use  . Smoking status: Current Every Day Smoker  . Smokeless tobacco: Never Used  Substance Use Topics  . Alcohol use: No  . Drug use: Not on file    Review of Systems Constitutional: No fever/chills Eyes: No visual changes. ENT: No sore throat. Cardiovascular: Denies chest pain. Respiratory: Denies shortness of breath. Gastrointestinal: No abdominal pain.  No nausea, no vomiting.  No diarrhea.  No constipation. Genitourinary: Negative for dysuria. Musculoskeletal: Negative for back pain. Skin: Negative for rash.  Callus plantar aspect of left foot Neurological: Negative for headaches, focal weakness or numbness. Endocrine:Diabetes and hyperlipidemia  ____________________________________________   PHYSICAL EXAM:  VITAL SIGNS: ED Triage Vitals  Enc Vitals Group     BP 12/27/17 1234 (!) 152/80     Pulse Rate 12/27/17 1234 94     Resp 12/27/17 1234 20     Temp 12/27/17 1234 97.7 F (36.5 C)     Temp Source 12/27/17 1234 Oral     SpO2 12/27/17 1234 100 %     Weight 12/27/17 1234 149 lb (67.6 kg)     Height 12/27/17 1234 5\' 10"  (1.778 m)     Head Circumference --      Peak Flow --      Pain Score 12/27/17 1243  10     Pain Loc --      Pain Edu? --      Excl. in GC? --    Constitutional: Alert and oriented. Well appearing and in no acute distress. Cardiovascular: Normal rate, regular rhythm. Grossly normal heart sounds.  Good peripheral circulation. Respiratory: Normal respiratory effort.  No retractions. Lungs CTAB. Neurologic:  Normal speech and language. No gross focal neurologic deficits are appreciated. No gait instability. Skin:  Skin is warm, dry and intact. No rash noted.  Erythema arising from the plantar aspect to the dorsal aspect of the left foot.  Thick callus plantar aspect  first metatarsal head. Psychiatric: Mood and affect are normal. Speech and behavior are normal.  ____________________________________________   LABS (all labs ordered are listed, but only abnormal results are displayed)  Labs Reviewed - No data to display ____________________________________________  EKG   ____________________________________________  RADIOLOGY  ED MD interpretation:    Official radiology report(s): No results found.  ____________________________________________   PROCEDURES  Procedure(s) performed: None  Procedures  Critical Care performed: No  ____________________________________________   INITIAL IMPRESSION / ASSESSMENT AND PLAN / ED COURSE  As part of my medical decision making, I reviewed the following data within the electronic MEDICAL RECORD NUMBER    Left foot pain secondary to callus and cellulitis.  Patient given discharge care instructions and advised to contact podiatry for definitive evaluation and treatment.  Patient started on Bactrim DS and given tramadol as needed for pain.      ____________________________________________   FINAL CLINICAL IMPRESSION(S) / ED DIAGNOSES  Final diagnoses:  Localized bacterial skin infection  Foot pain, left  Plantar callus     ED Discharge Orders        Ordered    sulfamethoxazole-trimethoprim (BACTRIM DS,SEPTRA DS) 800-160 MG tablet  2 times daily     12/27/17 1456    traMADol (ULTRAM) 50 MG tablet  Every 6 hours PRN     12/27/17 1456       Note:  This document was prepared using Dragon voice recognition software and may include unintentional dictation errors.    Joni Reining, PA-C 12/27/17 1506    Governor Rooks, MD 12/27/17 [phone]

## 2017-12-27 NOTE — Discharge Instructions (Addendum)
Contact podiatry clinic to scheduled appointment.

## 2017-12-29 ENCOUNTER — Other Ambulatory Visit
Admission: RE | Admit: 2017-12-29 | Discharge: 2017-12-29 | Disposition: A | Discharge status: Home / Self Care | Payer: Medicare Other | Source: Other Acute Inpatient Hospital | Attending: Podiatry | Admitting: Podiatry

## 2017-12-29 DIAGNOSIS — L97521 Non-pressure chronic ulcer of other part of left foot limited to breakdown of skin: Secondary | ICD-10-CM | POA: Diagnosis present

## 2018-01-03 LAB — AEROBIC/ANAEROBIC CULTURE W GRAM STAIN (SURGICAL/DEEP WOUND): Gram Stain: NONE SEEN

## 2018-05-09 IMAGING — CR DG THORACIC SPINE 2V
1 series · 3 of 3 positions shown · non-contrast
Comparison: No prior thoracic spine imaging. Chest x-rays
11/28/2011, 02/28/2008.

CLINICAL DATA: 66-year-old who slipped on ice and fell down the
deck stairs at his home this morning. Mid and low back pain. Initial
encounter.

EXAM:
THORACIC SPINE 2 VIEWS

[Series 1: dg thoracic spine 2 view · 0.14mm/px · 3 of 3 slices shown]
[im 1/3]
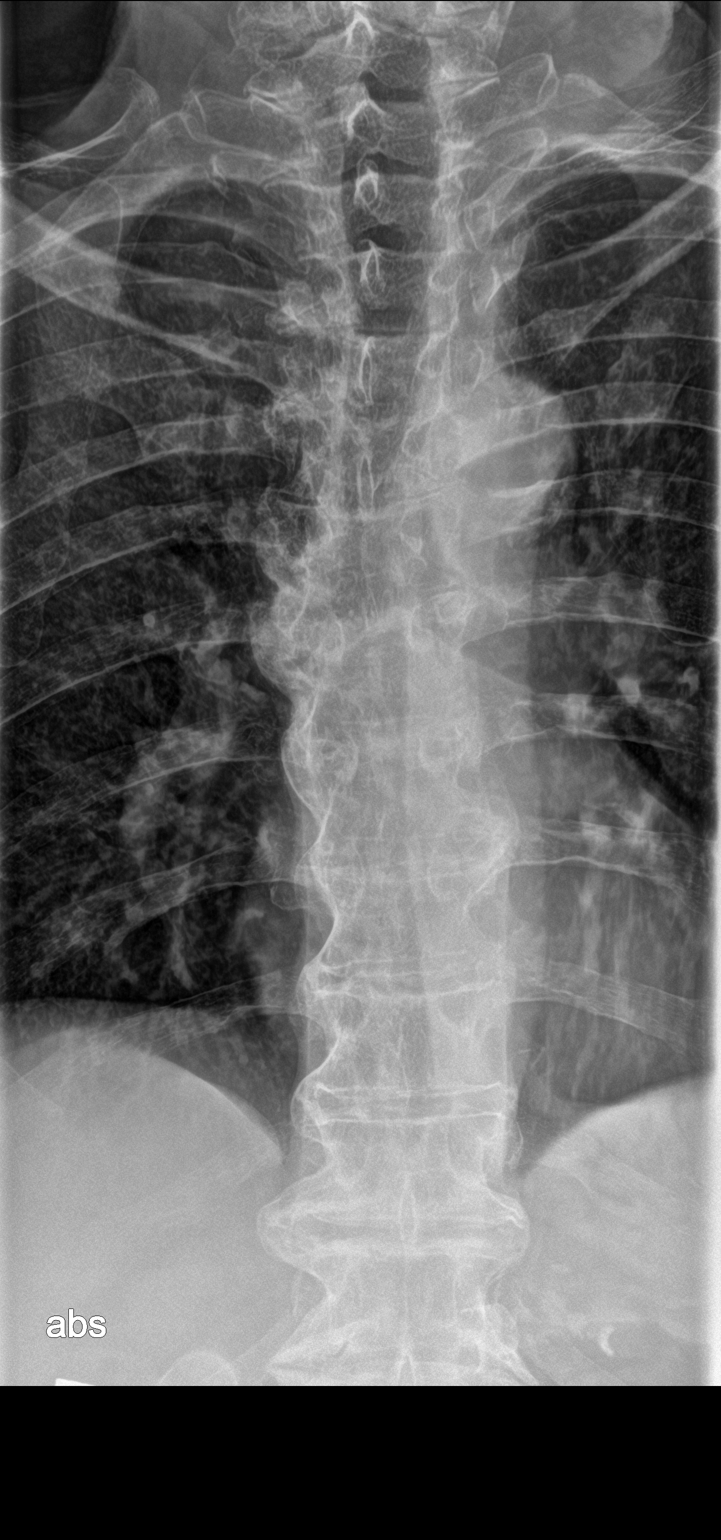
[im 2/3]
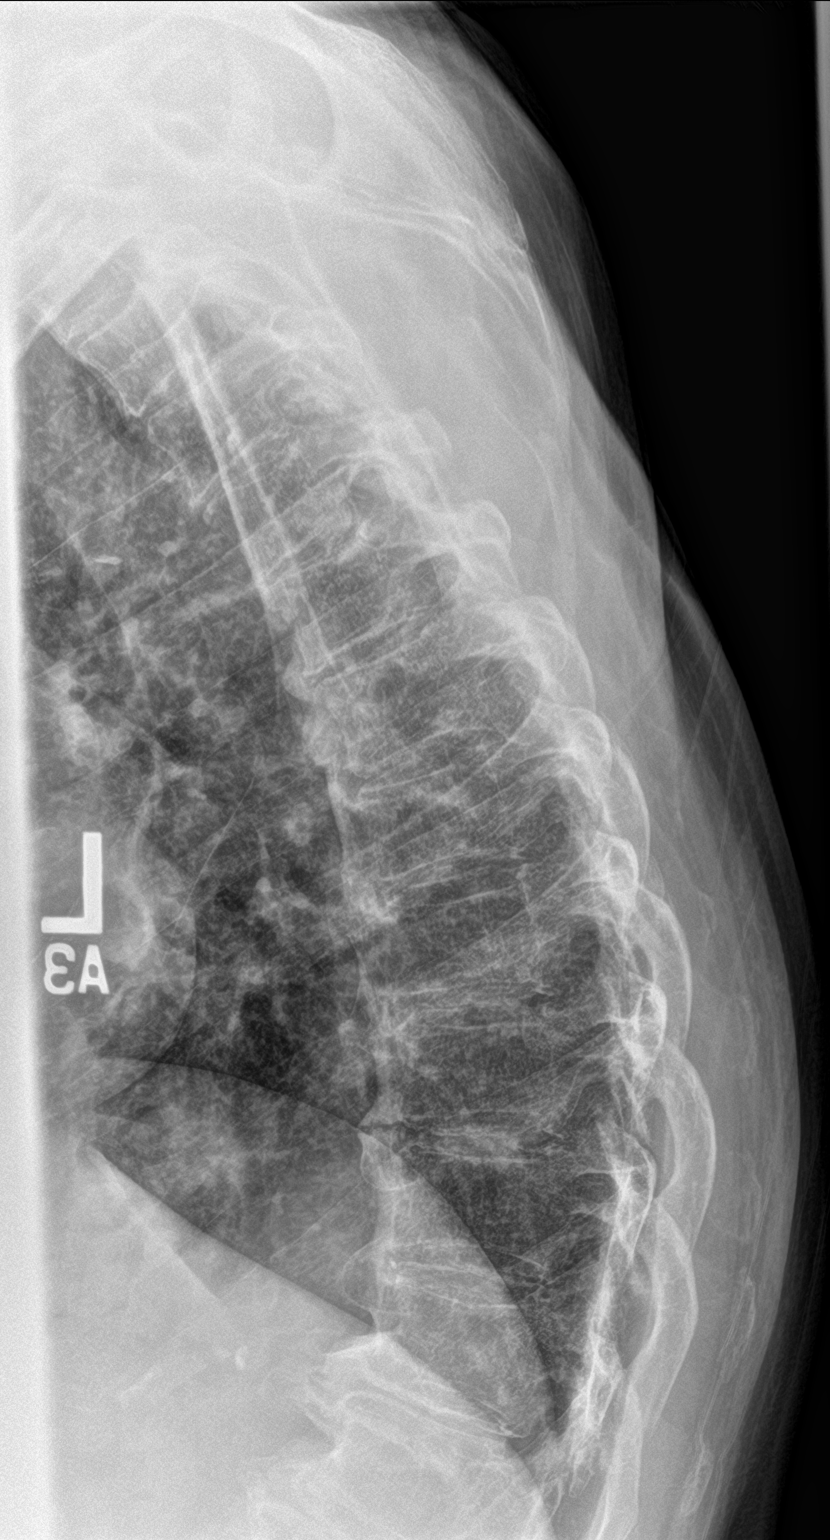
[im 3/3]
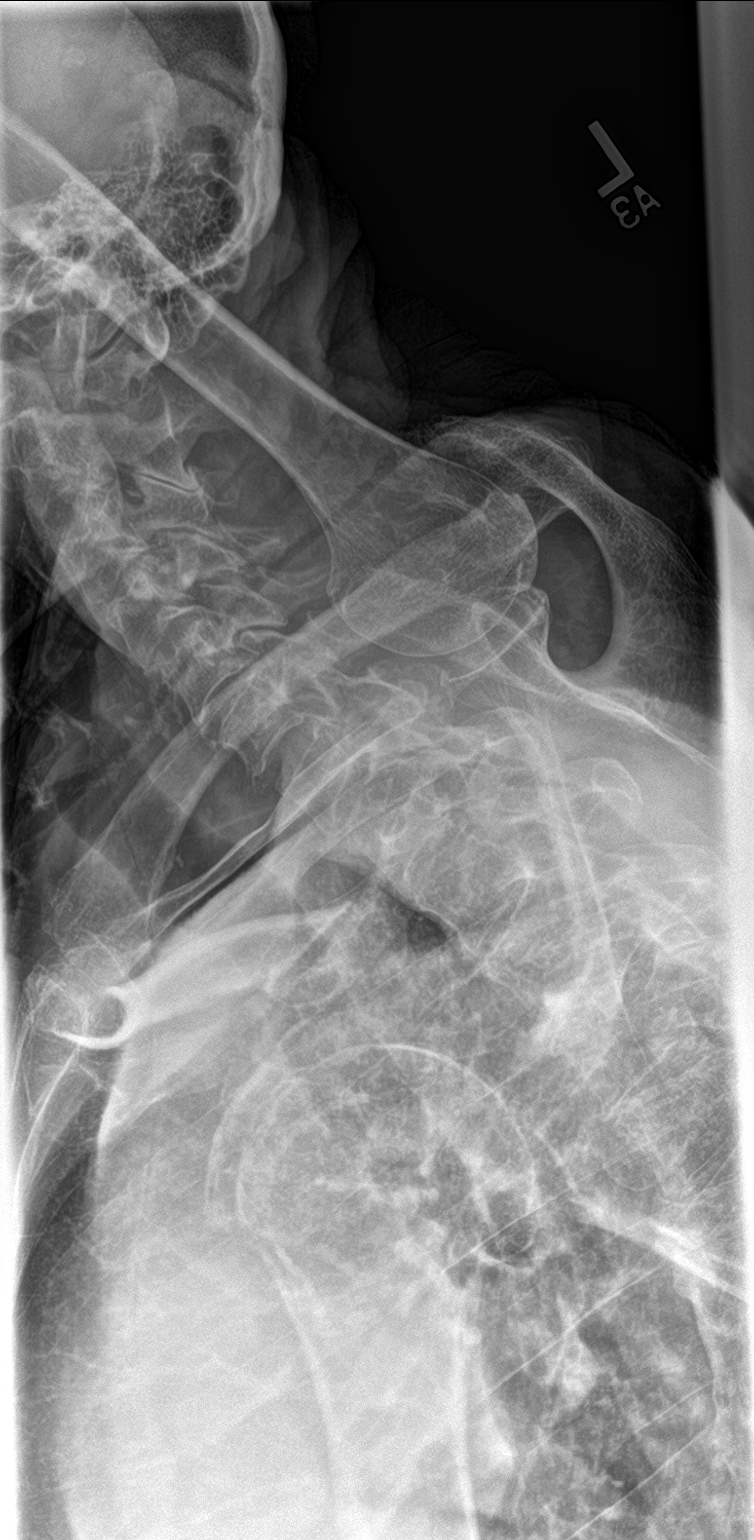

[3 of 3 positions shown; findings below may reference images not displayed]

FINDINGS: 12 rib-bearing thoracic vertebrae with anatomic posterior alignment.
No fractures. DISH throughout the thoracic spine. Pedicles intact.
No paraspinous hematoma.
IMPRESSION: 1. No acute osseous abnormality.
2. DISH.
